# Patient Record
Sex: Male | Born: 1945 | Race: White | Hispanic: No | Marital: Married | Smoking: Former smoker
Health system: Southern US, Community
[De-identification: ages and names within clinical notes are randomized; demographics above are authoritative.]

## PROBLEM LIST (undated history)

## (undated) ENCOUNTER — Emergency Department (HOSPITAL_COMMUNITY): Payer: Self-pay | Source: Home / Self Care

## (undated) DIAGNOSIS — I10 Essential (primary) hypertension: Secondary | ICD-10-CM

## (undated) HISTORY — PX: BACK SURGERY: SHX140

---

## 1986-10-08 DIAGNOSIS — M549 Dorsalgia, unspecified: Secondary | ICD-10-CM | POA: Insufficient documentation

## 2004-07-19 ENCOUNTER — Ambulatory Visit: Payer: Self-pay | Admitting: Family Medicine

## 2004-07-19 ENCOUNTER — Inpatient Hospital Stay (HOSPITAL_COMMUNITY): Admission: EM | Admit: 2004-07-19 | Discharge: 2004-07-20 | Payer: Self-pay | Admitting: Emergency Medicine

## 2005-07-23 ENCOUNTER — Emergency Department (HOSPITAL_COMMUNITY): Admission: EM | Admit: 2005-07-23 | Discharge: 2005-07-23 | Payer: Self-pay | Admitting: Family Medicine

## 2007-05-06 ENCOUNTER — Inpatient Hospital Stay (HOSPITAL_COMMUNITY): Admission: EM | Admit: 2007-05-06 | Discharge: 2007-05-09 | Payer: Self-pay | Admitting: Emergency Medicine

## 2009-10-27 ENCOUNTER — Observation Stay (HOSPITAL_COMMUNITY): Admission: EM | Admit: 2009-10-27 | Discharge: 2009-10-27 | Payer: Self-pay | Admitting: Emergency Medicine

## 2010-06-16 ENCOUNTER — Emergency Department (HOSPITAL_COMMUNITY)
Admission: EM | Admit: 2010-06-16 | Discharge: 2010-06-16 | Payer: Self-pay | Source: Home / Self Care | Admitting: Emergency Medicine

## 2010-12-21 LAB — CBC
HCT: 40.9 % (ref 39.0–52.0)
Hemoglobin: 13.6 g/dL (ref 13.0–17.0)
MCH: 30.6 pg (ref 26.0–34.0)
MCHC: 33.3 g/dL (ref 30.0–36.0)
MCV: 92.1 fL (ref 78.0–100.0)
Platelets: 238 10*3/uL (ref 150–400)
RBC: 4.44 MIL/uL (ref 4.22–5.81)
RDW: 12.2 % (ref 11.5–15.5)
WBC: 7.4 10*3/uL (ref 4.0–10.5)

## 2010-12-21 LAB — URINALYSIS, ROUTINE W REFLEX MICROSCOPIC
Bilirubin Urine: NEGATIVE
Glucose, UA: NEGATIVE mg/dL
Hgb urine dipstick: NEGATIVE
Ketones, ur: NEGATIVE mg/dL
Nitrite: NEGATIVE
Protein, ur: NEGATIVE mg/dL
Specific Gravity, Urine: 1.011 (ref 1.005–1.030)
Urobilinogen, UA: 0.2 mg/dL (ref 0.0–1.0)
pH: 5.5 (ref 5.0–8.0)

## 2010-12-21 LAB — DIFFERENTIAL
Basophils Absolute: 0 10*3/uL (ref 0.0–0.1)
Basophils Relative: 1 % (ref 0–1)
Eosinophils Absolute: 0.1 10*3/uL (ref 0.0–0.7)
Eosinophils Relative: 1 % (ref 0–5)
Lymphocytes Relative: 27 % (ref 12–46)
Lymphs Abs: 2 10*3/uL (ref 0.7–4.0)
Monocytes Absolute: 0.4 10*3/uL (ref 0.1–1.0)
Monocytes Relative: 5 % (ref 3–12)
Neutro Abs: 5 10*3/uL (ref 1.7–7.7)
Neutrophils Relative %: 67 % (ref 43–77)

## 2010-12-21 LAB — BASIC METABOLIC PANEL
BUN: 13 mg/dL (ref 6–23)
CO2: 28 mEq/L (ref 19–32)
Calcium: 9.7 mg/dL (ref 8.4–10.5)
Chloride: 107 mEq/L (ref 96–112)
Creatinine, Ser: 0.86 mg/dL (ref 0.4–1.5)
GFR calc Af Amer: >60 mL/min (ref >60)
GFR calc non Af Amer: >60 mL/min (ref >60)
Glucose, Bld: 187 mg/dL — ABNORMAL HIGH (ref 70–99)
Potassium: 4.5 mEq/L (ref 3.5–5.1)
Sodium: 140 mEq/L (ref 135–145)

## 2010-12-21 LAB — POCT CARDIAC MARKERS
CKMB, poc: <1 ng/mL — ABNORMAL LOW (ref 1.0–8.0)
Myoglobin, poc: 37.9 ng/mL (ref 12–200)
Troponin i, poc: <0.05 ng/mL (ref 0.00–0.09)

## 2010-12-24 LAB — COMPREHENSIVE METABOLIC PANEL
ALT: 13 U/L (ref 0–53)
AST: 15 U/L (ref 0–37)
Albumin: 3.2 g/dL — ABNORMAL LOW (ref 3.5–5.2)
Alkaline Phosphatase: 39 U/L (ref 39–117)
BUN: 8 mg/dL (ref 6–23)
CO2: 25 mEq/L (ref 19–32)
Calcium: 7.9 mg/dL — ABNORMAL LOW (ref 8.4–10.5)
Chloride: 108 mEq/L (ref 96–112)
Creatinine, Ser: 0.58 mg/dL (ref 0.4–1.5)
GFR calc Af Amer: >60 mL/min (ref >60)
GFR calc non Af Amer: >60 mL/min (ref >60)
Glucose, Bld: 93 mg/dL (ref 70–99)
Potassium: 3.5 mEq/L (ref 3.5–5.1)
Sodium: 141 mEq/L (ref 135–145)
Total Bilirubin: 0.8 mg/dL (ref 0.3–1.2)
Total Protein: 5.3 g/dL — ABNORMAL LOW (ref 6.0–8.3)

## 2010-12-24 LAB — POCT I-STAT, CHEM 8
BUN: 18 mg/dL (ref 6–23)
Calcium, Ion: 1.1 mmol/L — ABNORMAL LOW (ref 1.12–1.32)
Chloride: 107 mEq/L (ref 96–112)
Creatinine, Ser: 0.9 mg/dL (ref 0.4–1.5)
Glucose, Bld: 118 mg/dL — ABNORMAL HIGH (ref 70–99)
HCT: 36 % — ABNORMAL LOW (ref 39.0–52.0)
Hemoglobin: 12.2 g/dL — ABNORMAL LOW (ref 13.0–17.0)
Potassium: 3.9 mEq/L (ref 3.5–5.1)
Sodium: 140 mEq/L (ref 135–145)
TCO2: 26 mmol/L (ref 0–100)

## 2010-12-24 LAB — GLUCOSE, CAPILLARY: Glucose-Capillary: 93 mg/dL (ref 70–99)

## 2010-12-24 LAB — CK TOTAL AND CKMB (NOT AT ARMC)
CK, MB: 2 ng/mL (ref 0.3–4.0)
Relative Index: INVALID (ref 0.0–2.5)
Total CK: 90 U/L (ref 7–232)

## 2010-12-24 LAB — CBC
HCT: 31.7 % — ABNORMAL LOW (ref 39.0–52.0)
HCT: 34.3 % — ABNORMAL LOW (ref 39.0–52.0)
Hemoglobin: 10.8 g/dL — ABNORMAL LOW (ref 13.0–17.0)
Hemoglobin: 12.1 g/dL — ABNORMAL LOW (ref 13.0–17.0)
MCHC: 34 g/dL (ref 30.0–36.0)
MCHC: 35.3 g/dL (ref 30.0–36.0)
MCV: 92.3 fL (ref 78.0–100.0)
MCV: 94 fL (ref 78.0–100.0)
Platelets: 205 10*3/uL (ref 150–400)
Platelets: 244 10*3/uL (ref 150–400)
RBC: 3.38 MIL/uL — ABNORMAL LOW (ref 4.22–5.81)
RBC: 3.72 MIL/uL — ABNORMAL LOW (ref 4.22–5.81)
RDW: 13.2 % (ref 11.5–15.5)
RDW: 13.6 % (ref 11.5–15.5)
WBC: 12.2 10*3/uL — ABNORMAL HIGH (ref 4.0–10.5)
WBC: 8.2 10*3/uL (ref 4.0–10.5)

## 2010-12-24 LAB — URINALYSIS, ROUTINE W REFLEX MICROSCOPIC
Bilirubin Urine: NEGATIVE
Glucose, UA: NEGATIVE mg/dL
Hgb urine dipstick: NEGATIVE
Ketones, ur: NEGATIVE mg/dL
Nitrite: NEGATIVE
Protein, ur: NEGATIVE mg/dL
Specific Gravity, Urine: 1.011 (ref 1.005–1.030)
Urobilinogen, UA: 0.2 mg/dL (ref 0.0–1.0)
pH: 5.5 (ref 5.0–8.0)

## 2010-12-24 LAB — CARDIAC PANEL(CRET KIN+CKTOT+MB+TROPI)
CK, MB: 1.4 ng/mL (ref 0.3–4.0)
CK, MB: 1.6 ng/mL (ref 0.3–4.0)
Relative Index: INVALID (ref 0.0–2.5)
Relative Index: INVALID (ref 0.0–2.5)
Total CK: 64 U/L (ref 7–232)
Total CK: 73 U/L (ref 7–232)
Troponin I: 0.02 ng/mL (ref 0.00–0.06)
Troponin I: 0.03 ng/mL (ref 0.00–0.06)

## 2010-12-24 LAB — DIFFERENTIAL
Basophils Absolute: 0.1 10*3/uL (ref 0.0–0.1)
Basophils Absolute: 0.1 10*3/uL (ref 0.0–0.1)
Basophils Relative: 0 % (ref 0–1)
Basophils Relative: 1 % (ref 0–1)
Eosinophils Absolute: 0 10*3/uL (ref 0.0–0.7)
Eosinophils Absolute: 0 10*3/uL (ref 0.0–0.7)
Eosinophils Relative: 0 % (ref 0–5)
Eosinophils Relative: 0 % (ref 0–5)
Lymphocytes Relative: 17 % (ref 12–46)
Lymphocytes Relative: 9 % — ABNORMAL LOW (ref 12–46)
Lymphs Abs: 1 10*3/uL (ref 0.7–4.0)
Lymphs Abs: 1.4 10*3/uL (ref 0.7–4.0)
Monocytes Absolute: 0.5 10*3/uL (ref 0.1–1.0)
Monocytes Absolute: 0.5 10*3/uL (ref 0.1–1.0)
Monocytes Relative: 4 % (ref 3–12)
Monocytes Relative: 7 % (ref 3–12)
Neutro Abs: 10.5 10*3/uL — ABNORMAL HIGH (ref 1.7–7.7)
Neutro Abs: 6.2 10*3/uL (ref 1.7–7.7)
Neutrophils Relative %: 76 % (ref 43–77)
Neutrophils Relative %: 87 % — ABNORMAL HIGH (ref 43–77)

## 2010-12-24 LAB — POCT CARDIAC MARKERS
CKMB, poc: 1.4 ng/mL (ref 1.0–8.0)
Myoglobin, poc: 62.2 ng/mL (ref 12–200)
Troponin i, poc: <0.05 ng/mL (ref 0.00–0.09)

## 2010-12-24 LAB — MYOGLOBIN, URINE: Myoglobin, Ur: <27 mcg/L (ref <28)

## 2011-02-20 NOTE — Consult Note (Signed)
[name], [name] NO.:  [id number]   MEDICAL RECORD NO.:  [id number]          PATIENT TYPE:  INP   LOCATION:  6739                         FACILITY:  [facility]   PHYSICIAN:  Kathaleen Maser. Pool, M.D.    DATE OF BIRTH:  01/25/1946   DATE OF CONSULTATION:  05/07/2007  DATE OF DISCHARGE:                                 CONSULTATION   SERVICE:  Neurosurgery.   HISTORY OF PRESENT ILLNESS:  Mr. Louis [name] is a 65 year old male who has a  remote history of an L5-S1 laminotomy and diskectomy done in [place]  approximately 4 years ago.  The patient has had some chronic lumbosacral  pain without significant radiculopathy since that time.  The patient now  has an approximately 10-day history of severe lumbar pain with radiation  to both lower extremities.  This has progressively worsened with  increasing numbness, paresthesias and weakness involving both lower  extremities.  The patient suffered a fall late Saturday night with  increasing back pain and bilateral lower extremity symptoms.  This has  also been associated with increasing numbness involving his perineal and  perianal region.  He remains continent of urine and continent of stool;  however, he does admit that his voiding sensation and urgency do not  feel normal to him.  The patient has been essentially nonambulatory for  a least 1 week.  He reports that he has been unable to move his feet for  at least 72 hours, if not longer.   PAST MEDICAL HISTORY:  Is notable for the aforementioned lumbar surgery.  The patient has a history of esophageal spasms, otherwise is in  reasonably good health.   CURRENT MEDICATIONS:  Percocet, Relafen, he has been on a steroid  Dosepak, trazodone and clonazepam.   SOCIAL HISTORY:  The patient is a smoker.  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  He is married.   REVIEW OF SYSTEMS:  Noncontributory.   EXAMINATION:  He is awake and alert, oriented and appropriate.  Cranial  nerve function is intact.  Examination of the upper extremities reveal  intact motor strength and sensory function bilaterally.  Examination of  his lower extremities reveal weakness involving his anterior tibialis  and extensor hallicis longus, grading out at 1/5 to 2/5 bilaterally.  He  has 2/5 strength in both gastrocnemius muscles.  He has diminished  sensation from L5 distally bilaterally.  He does have some saddle  anesthesia in his perineal region.   I reviewed the patient's MRI scan of his lumbar spine.  This  demonstrates evidence of critical stenosis at the L4-5 level secondary  to facet arthropathy and a superimposed large central disk herniation by  somewhat towards the right.   I believe this patient is suffering symptoms of severe spinal stenosis  secondary to the disk herniation at L4-5 and his coexistent spinal  stenosis from facet arthropathy.  Given the degree of his stenosis  coupled with his marked symptomatology, I think that surgical  decompression is his only option for improvement.  I would restart him  on IV steroids,  and our plan is to proceed urgently with an L4-5  decompressive laminectomy and probable bilateral microdiskectomy.  I  discussed the risks and benefits involved with surgery including but not  limited to risk of  anesthesia, bleeding, infection, CSF leak, nerve root injury, disk  herniation, __________ , pain not better.  The patient has been given  the opportunity to ask questions and appears to understand.  He wishes  to proceed with surgery.  We will move forward with this tomorrow around  3 p.m.           ______________________________  Kathaleen Maser. Pool, M.D.     HAP/MEDQ  D:  05/07/2007  T:  05/07/2007  Job:  [id number]

## 2011-02-20 NOTE — Op Note (Signed)
[name], ROSSETTI NO.:  [id number]   MEDICAL RECORD NO.:  [id number]          PATIENT TYPE:  INP   LOCATION:  6739                         FACILITY:  [facility]   PHYSICIAN:  Kathaleen Maser. Pool, M.D.    DATE OF BIRTH:  03/07/1946   DATE OF PROCEDURE:  05/07/2007  DATE OF DISCHARGE:                               OPERATIVE REPORT   ATTENDING PHYSICIAN:  Sherilyn Cooter A. Pool, M.D.   SERVICE:  Neurosurgery.   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   PROCEDURE:  Re-exploration of L4-5 laminectomy with redo bilateral  decompressive laminectomy and L4 and L5 nerve root foraminotomies.  Right L4-5 microdiskectomy.   SURGEON:  Kathaleen Maser. Pool, M.D.   ASSISTANT:  Donalee Citrin, M.D.   ANESTHESIA:  General endotracheal.   INDICATIONS:  Mr. Louis [name] is a 65 year old male who is status post  previous lumbar surgery at L4-5 and L5-S1.  The patient presents now  with severe back and bilateral lower extremity pain, paresthesias and  weakness with evidence of diminished bowel and bladder function and  saddle anesthesia around his perineum consistent with a cauda equina  syndrome which is fortunately not yet complete.  The patient has  undergone an MRI scan which demonstrates critical stenosis at L4-5  secondary to facet arthropathy, ligamentous buckling and an acute right-  sided paracentral disk herniation.  We discussed options of management  including possibility of moving forward with a relatively emergent L4-5  decompression with microdiskectomy.  Patient is aware of the risks and  benefits including but not limited to risks of anesthesia, risks of  bleeding, infection, CSF leak, nerve root injury, disk herniation,  worsening or continued pain and nonbenefit.  The patient was given the  opportunity to answer questions and wished to proceed.   OPERATIVE NOTE:  The patient  was brought to the operating room and  placed on the table in the supine position.  After an adequate level of  anesthesia was achieved, the patient was positioned prone onto the  Wilson frame, appropriately padded and positioned.  The lumbar region  was prepped and draped sterilely.  A 10 blade was used to make a skin  incision overlying the L4-5 area.  This was carried down carried sharply  in the midline.  Subperiosteal dissection was then performed exposing  the lamina and facet joints at L4 and L5 bilaterally.  The patient has  had a previous complete laminectomy at L5 which tracks up into the L4-5  interspace.  Epidural scar was dissected free and removed using Kerrison  rongeurs.  Laminectomy of L4 was then performed using high-speed drill  and Kerrison rongeurs to remove the inferior two-thirds of the lamina of  L4, medial aspect of the L4-5 facet joint bilaterally and residual  aspects of the L5 lamina bilaterally.  Ligament flavum and epidural scar  were then elevated and resected in piecemeal fashion using Kerrison  rongeurs.  Underlying thecal sac and exiting L4 and L5 nerve roots were  identified.  Wide decompressive foraminotomy was then performed along  the course of the exiting nerve roots bilaterally.  On the right side,  the thecal sac and L5 nerve was gently mobilized and tracked towards the  midline.  The disk herniation was then identified and dissected free  using blunt nerve hooks.  Large fragments of disk herniation were  encountered and completely resected.  Disk space was then incised with a  15 blade in a rectangular fashion.  A wide disk space clean-out was  achieved using pituitary rongeurs, up and downbiting pituitary rongeurs  and Epstein curettes.  All elements of the disk herniation were  completely resected.  All loose or obviously degenerative disk material  was removed.  A small laceration of the ventral surface of the L5 nerve  root was made on the  right side during the dissection of the nerve root  from the surrounding scar.  This transiently leaked a small amount of  CSF, but this spontaneously stopped.  There was nothing visible that I  thought would benefit from a suture.  The spinal canal was inspected  also from the left side.  There was no evidence of any residual  compression or disk herniation.  The wound was then irrigated with  antibiotics solution.  Tisseel fibrin glue sealant was then placed in  the laminectomy defect as was Gelfoam.  The wound was then closed in  layers with Vicryl sutures, and the skin was reapproximated with a  running interlocking 3-0 nylon and in a vertical mattress fashion.  The  patient tolerated the procedure well and he returned to the recovery  room postoperatively.           ______________________________  Kathaleen Maser Pool, M.D.     HAP/MEDQ  D:  05/07/2007  T:  05/08/2007  Job:  [id number]

## 2011-02-20 NOTE — H&P (Signed)
NAMEANDREJ, SPAGNOLI NO.:  [id number]   MEDICAL RECORD NO.:  [id number]          PATIENT TYPE:  EMS   LOCATION:  MINO                         FACILITY:  [facility]   PHYSICIAN:  Michaelyn Barter, M.D. DATE OF BIRTH:  1946/08/31   DATE OF ADMISSION:  05/06/2007  DATE OF DISCHARGE:                              HISTORY & PHYSICAL   PRIMARY CARE DOCTOR:  Unassigned.   CHIEF COMPLAINT:  Back pain.   HISTORY OF PRESENT ILLNESS:  Mr. Ardis is a 65 year old gentleman who  states that he had some ongoing chronic lower back pain; however,  approximately one week ago, the patient was camping over the course of  the weekend.  He states that he did not increase his activity level, he  was simply completing his regular ADLs when he began to develop lower  back pain.  There was no trauma to his lower back.  According to his  wife, at one particular instance, he had attempted to walk up the stairs  into their RV, and his legs buckled secondary to the pain.  They left  the campsite, and the following Monday, he called his primary care  physician, who started him on a tapering dose of prednisone.  He  indicated that the prednisone did help to alleviate his pain; however,  the patient's wife indicated that the patient's ability to walk had  become significantly compromised.  For approximately a week, the  patient, who typically walks without assistance, has been reduced to  crawling on both his hands and knees.  The patient indicates that his  pain level had decreased, until yesterday when he attempted to use a  bedside toilet.  As he attempted to get up off of the toilet, his lower  buttock area became entangled between the toilet and a chair that was  nearby.  Shortly afterwards his tailbone began to hurt.  Since that  time, it has been constant.  He indicates that he chronically has some  numbness within his lower extremities; however, yesterday the numbness  in his left foot in  particular was a bit more pronounced.  He denies any  incontinence for urine or his bowels.  There have been no fevers or  chills.   PAST MEDICAL HISTORY:  1. Lower back pain.  2. Fibromyalgia.  3. GERD.  4. Post-traumatic stress disorder.  5. Diabetes mellitus.  The patient states that he was diagnosed with      diabetes mellitus some years ago; however, he has not had to take      any medications, it has simply been diet-controlled.  6. Post-traumatic stress disorder.  7. Panic attacks.  8. Hypertension.  9. Fibromyalgia.  10.Hypoglycemia.   PAST SURGICAL HISTORY:  The patient had lower back surgery on the lumbar  region of his back approximately five years ago in [place].   ALLERGIES:  PENICILLIN produces hives.   CURRENT MEDICATIONS:  1. Oxycodone 5 mg 3 tablets p.o. q.i.d.  2. Prednisone tapering dose pack.  3. Nabumetone 500 mg 2 tablets p.o. daily.  4. Trazodone 50 mg 1-2 tablets  p.o. nightly.  5. Clonazepam 1 mg tablet p.o. in the morning and a half tablet p.o.      nightly.   SOCIAL HISTORY:  Cigarettes:  The patient stopped smoking 15 years ago.  He smoked 1-1/2 packs per day prior to that.  Alcohol:  The patient  stopped drinking alcohol 20 years ago.  He drank heavily prior to that.   FAMILY HISTORY:  Mother had heart disease and rheumatoid arthritis.  Father had Alzheimer's dementia and prostate cancer.   REVIEW OF SYSTEMS:  As per HPI.   PHYSICAL EXAMINATION:  VITALS:  Temperature is 98.1.  Blood pressure  165/74.  Heart rate is 94.  Respirations 20.  O2 sat 97%.  HEENT:  Normocephalic and atraumatic.  Anicteric.  Extraocular movements  are intact.  Oral mucosa is pink.  No thrush.  No exudate.  NECK:  No JVD.  Supple.  No lymphadenopathy.  No thyromegaly.  CARDIAC:  S1 and S2 present.  Regular rate and rhythm.  No murmurs, no  gallops, no rubs.  RESPIRATORY:  No crackles or wheezes.  ABDOMEN:  Flat, soft, nontender, nondistended.  Positive bowel sounds.   No masses palpated.  EXTREMITIES:  No leg edema.  BACK:  There are obvious signs of trauma to the patient's back.  There  is no ecchymotic areas on the patient's back.  With regards to an area  that is most pronounced in pain location, this would be the area  corresponding to the lowest sacral region involving the coccyx area.  The patient had no significant tenderness to palpation of his lumbar  area, but he did complain of some tenderness over the lower  sacral/coccyx area to palpation.  MUSCULOSKELETAL:  Arm strength 5/5.  Bilateral leg strength 5/5.  Sensation appeared to be intact over both lower extremities; however,  the patient did complain of decreased sensation on the dorsal surface of  his both of his feet bilaterally.  NEUROLOGIC:  Patient is alert and oriented x3.  Cranial nerves II-XII  are intact.   An MRI was completed, which revealed the most significant finding at the  L4-5 region where there is right disk protrusion with marked spinal  stenosis.   LABS:  The pH is 7.458, pCO2 38.4, bicarb 27.1.  Hemoglobin 16.7,  hematocrit 49.  Sodium 139, potassium 4.4, chloride 104, glucose 120,  BUN 23, creatinine 0.8.  Urinalysis is negative.   ASSESSMENT/PLAN:  1. Acute on chronic back pain.  The etiology of this may be related to      the L4-5 disk protrusion with spinal stenosis seen on MRI, which      may have been exacerbated by the trauma that occurred as the      patient attempted to rise from the toilet yesterday.  We will      provide the patient with p.r.n. pain medication for now.  We will      consider resuming steroids on the patient.  We consider an      orthopedic versus neurosurgery consultation.  2. Hypertension:  This is slightly elevated.  We will consider      starting an antihypertensive medication on the patient.  3. History of post-traumatic stress disorder.  We will resume the      patient's previously prescribed home      medications.  4.  Gastrointestinal prophylaxis:  We will provide Protonix.  5. Deep venous thrombosis prophylaxis:  We will provide Lovenox.      [employer]  Roxan Hockey, M.D.  Electronically Signed     OR/MEDQ  D:  05/06/2007  T:  05/06/2007  Job:  [id number]

## 2011-02-23 NOTE — Discharge Summary (Signed)
NAMEVIHAAN, GLOSS NO.:  [id number]   MEDICAL RECORD NO.:  [id number]          PATIENT TYPE:  INP   LOCATION:  2041                         FACILITY:  [facility]   PHYSICIAN:  Pearlean Brownie, M.D.DATE OF BIRTH:  03/31/1946   DATE OF ADMISSION:  07/18/2004  DATE OF DISCHARGE:  07/19/2004                                 DISCHARGE SUMMARY   DIAGNOSES:  1.  Hypoglycemia.  2.  Diabetes mellitus type 1.  3.  Post-traumatic stress disorder.  4.  Hypertension.   CONSULTATIONS:  No consultations were done this admission.   PROCEDURE:  No procedures were done this admission.   MEDICATIONS:  1.  Insulin Regular 40 units at night.  2.  Sliding scale insulin Novolog.  3.  Norvasc 10 mg q.d.  4.  Toprol XL 150 mg q.d.  5.  Vicodin 5/500 mg p.r.n. pain.   LABORATORY DATA:  On admission, sodium 140, potassium 3.5, chloride 103,  bicarbonate 20, BUN 17, creatinine 1.5, glucose 77.  CBC showed white blood  cell 6.4, hemoglobin 12.9, hematocrit 38, platelets 285,000.  AST 21, ALT  23, alkaline phosphate 110, total bilirubin 0.3.  Urinalysis negative.  Blood culture pending.  Hemoglobin glycosylate pending.  TSH pending.  Cardiac markers show a troponin less than 0.01.  CK-MB 2.3, CK 209.   Chest x-ray within normal limits.  ECG normal sinus rhythm, PVCs and PACs,  infarct age undetermined.  Telemetry at discharge shows normal sinus rhythm.   DISPOSITION:  The patient was admitted with hypoglycemia and with a history  of recent upper respiratory tract infection.   HOSPITAL COURSE:  Problem 1:  HYPOGLYCEMIA:  The patient was ruled out for  infection.  CBC within normal limits.  Possible misunderstanding of  management of diabetes.   Problem 2:  HYPERTENSION:  It stayed stable during admission.  We readjusted  medication and add hydrochlorothiazide, ACE inhibitor to the treatment.   Problem 3:  ECTOPIC HEART RATE:  Normal sinus rhythm during hospital stay.  The vital  signs were within normal limits and normalized to sinus regular  rhythm of 69.   DISCHARGE MEDICATIONS:  1.  Hydrochlorothiazide/Lisinopril 12.5/20 mg 1 tablet p.o. q.d.  2.  Norvasc 10 mg p.o. q.d.  3.  Toprol XL 50 mg p.o. q.d.  4.  Lantus insulin 30 units at night.  5.  Sliding scale insulin Novolog as needed.   DIET:  Fixed diet, carbohydrate-modified diet.   FOLLOW UP:  Follow-up appointment with Dr. Jimmey Ralph July 25, 2004 at 4:45  p.m.       IM/MEDQ  D:  07/19/2004  T:  07/19/2004  Job:  [id number]   cc:   H. Tilda Franco, M.D.  [address]  [place]  [place] [address]  Fax: [phone]

## 2011-02-23 NOTE — Discharge Summary (Signed)
NAMEBINNIE, Louis [name] NO.:  [id number]   MEDICAL RECORD NO.:  [id number]          PATIENT TYPE:  INP   LOCATION:  6739                         FACILITY:  [facility]   PHYSICIAN:  Kathaleen Maser. Pool, M.D.    DATE OF BIRTH:  1946-03-31   DATE OF ADMISSION:  05/06/2007  DATE OF DISCHARGE:  05/09/2007                               DISCHARGE SUMMARY   FINAL DIAGNOSIS:  L4-5 stenosis/herniated nucleus pulposus with cauda  equina syndrome.   HISTORY OF PRESENT ILLNESS:  Mr. Louis [name] is a 65 year old male with a  history of chronic back pain status post previous L5-S1 laminectomy  remotely in the past, who presents with severe worsening of pain and  bilateral lower extremity weakness consistent with a severe cauda equina  syndrome.  Workup demonstrates evidence of critical stenosis at L4-5  secondary to facet arthropathy and a broad-based disk herniation.  The  patient presents now for surgical decompression after a consultation was  made of me by the medicine service.   HOSPITAL COURSE:  The patient was admitted by medicine and workup  demonstrated the stenosis.  A referral was made to me.  I assumed his  care and took him to the operating room, where uncomplicated L4-5  decompressive laminectomy and right-sided L4-5 microdiskectomy was  performed.  Postoperatively the patient was much improved.  He had  improved motor and sensory function.  Bowel and bladder function  returned.  The patient was gradually able to be mobilized with the aid  of physical therapy.  He was able to be discharged home.  At the time of  discharge the patient is ambulatory without assistance.  His wound is  healing well.  His strength is much improved.  He now has of 3/5  dorsiflexion strength in both feet and 4/5 plantar flexion strength in  both feet.   CONDITION ON DISCHARGE:  Improved.           ______________________________  Kathaleen Maser Pool, M.D.     HAP/MEDQ  D:  07/01/2007  T:  07/02/2007   Job:  [id number]

## 2011-02-23 NOTE — Discharge Summary (Signed)
NAMEBEUFORD, GARCILAZO NO.:  [id number]   MEDICAL RECORD NO.:  [id number]          PATIENT TYPE:  INP   LOCATION:  2041                         FACILITY:  [facility]   PHYSICIAN:  Adrian Blackwater, MDDATE OF BIRTH:  06-Jul-1946   DATE OF ADMISSION:  07/19/2004  DATE OF DISCHARGE:  07/20/2004                                 DISCHARGE SUMMARY   ADMISSION DIAGNOSIS:  Chest tightness.   DISCHARGE DIAGNOSIS:  Atypical chest discomfort with vagal syncope.   OTHER DIAGNOSIS:  1.  Fibromyalgia.  2.  Gastroesophageal reflux disease, esophageal spasms.  3.  PTSD.   CONSULTATIONS:  None.   PROCEDURES:  Chest x-ray October 12 was within normal limits.   MEDICATIONS AT DISCHARGE:  1.  Aspirin 325 mg one tablet daily.  2.  Celexa 20 mg daily.  3.  Omeprazole 20 mg daily with meals.  4.  Relafen 500 mg two tablets daily as needed for pain.  5.  Continue home medication regimen for IBS.   HISTORY OF PRESENT ILLNESS:  This is a 65 year old white male with history  of fibromyalgia who was sitting at his desk as usual and felt something on  his leg.  He stood up, went to the bathroom and became sweaty with shortness  of breath and almost fell down.  He experienced some kind of chest tightness  and hard palpitations.  Denies any history of coronary artery disease,  angina or arrhythmia.  With the presyncopal episode, he became shaky and  confused.  Denies any loss of consciousness or incontinence.  He came to the  ER by car.  At admission, he had low blood pressure resolving within  minutes.  He denies any clumsiness, falls, or slurred speech.  The patient  was put on aspirin 325 mg daily as DVT and cardiovascular prophylaxis.  Continue Celexa treatment for his anxiety and Relafen for chronic pain.  Relafen was discontinued.  Switched to Darvocet, trying to relieve his pain  and better control of possible GERD.  He also received Protonix 40 mg p.o.  b.i.d. as prophylaxis.   Also, received nitroglycerin 0.4 mg sublingual for  this pain, possible cardiac origin.  He was ordered p.r.n.   During hospital course, the pain improved, being 3 over 10 at discharge.  The patient started having some diarrhea due to irritable bowel syndrome  that he controlled with TUMS.   LABORATORY DATA:  During hospital course, Hemoglobin 14.1, hematocrit 40.2.  Sodium 138, potassium 4.2, chloride 107, bicarbonate 24, BUN 10, creatinine  1, glucose 116, lipase 26, albumin 3.5, total protein 5.6, AST 24, ALT 27,  alkaline phosphatase 55, total bilirubin 0.4.  Cardiac enzyme panel was done  at the emergency department x3 within normal limits and was repeated every 8  hours x3 within normal limits.  Fasting lipid profile was done.  Cholesterol  193, HDL 43, VDL 26, triglyceride 132, LDL 124.  Parameters are within  normal limits.  No increased risk of CAD at this moment.  Hemoglobin  glucosamine 5.3 which is normal.  Urinalysis was done and showed few  bacteria born nitrates negative, leukocytes were negative.  Not compatible  with urinary infection at this moment, but may need further workup due to  current history of UTI.  EKG done within normal limits.  If the patient  continues with episodes of brief syncope, he may need further workup with  echocardiogram or stress test as an outpatient.   DISCHARGE SUMMARY:  Attending physician Pearlean Brownie, M.D.       IM/MEDQ  D:  07/20/2004  T:  07/20/2004  Job:  [id number]   cc:   Corrie Mckusick, M.D.  Fax: [phone]

## 2011-07-23 LAB — BASIC METABOLIC PANEL
CO2: 26
Chloride: 104
GFR calc Af Amer: >60
Sodium: 138

## 2011-07-23 LAB — I-STAT 8, (EC8 V) (CONVERTED LAB)
BUN: 23
Chloride: 104
pCO2, Ven: 38.2 — ABNORMAL LOW
pH, Ven: 7.458 — ABNORMAL HIGH

## 2011-07-23 LAB — CBC
HCT: 42.3
Hemoglobin: 14.5
MCHC: 34.3
MCV: 91.7
RBC: 4.61

## 2011-07-23 LAB — LIPID PANEL
HDL: 48
Triglycerides: 108
VLDL: 22

## 2011-07-23 LAB — URINALYSIS, ROUTINE W REFLEX MICROSCOPIC
Bilirubin Urine: NEGATIVE
Ketones, ur: NEGATIVE
Nitrite: NEGATIVE
Specific Gravity, Urine: 1.022
Urobilinogen, UA: 0.2

## 2011-07-23 LAB — POCT I-STAT CREATININE: Creatinine, Ser: 0.8

## 2016-06-08 DEATH — deceased

## 2020-02-16 ENCOUNTER — Emergency Department (HOSPITAL_COMMUNITY)
Admission: EM | Admit: 2020-02-16 | Discharge: 2020-02-16 | Disposition: A | Discharge status: Home / Self Care | Payer: No Typology Code available for payment source | Attending: Emergency Medicine | Admitting: Emergency Medicine

## 2020-02-16 ENCOUNTER — Other Ambulatory Visit: Payer: Self-pay

## 2020-02-16 ENCOUNTER — Emergency Department (HOSPITAL_COMMUNITY): Payer: No Typology Code available for payment source

## 2020-02-16 ENCOUNTER — Encounter (HOSPITAL_COMMUNITY): Payer: Self-pay | Admitting: Emergency Medicine

## 2020-02-16 DIAGNOSIS — M545 Low back pain, unspecified: Secondary | ICD-10-CM

## 2020-02-16 DIAGNOSIS — M25552 Pain in left hip: Secondary | ICD-10-CM | POA: Insufficient documentation

## 2020-02-16 DIAGNOSIS — Z79899 Other long term (current) drug therapy: Secondary | ICD-10-CM | POA: Diagnosis not present

## 2020-02-16 DIAGNOSIS — Z7901 Long term (current) use of anticoagulants: Secondary | ICD-10-CM | POA: Diagnosis not present

## 2020-02-16 DIAGNOSIS — I10 Essential (primary) hypertension: Secondary | ICD-10-CM | POA: Insufficient documentation

## 2020-02-16 HISTORY — DX: Essential (primary) hypertension: I10

## 2020-02-16 MED ORDER — LIDOCAINE 5 % EX PTCH
1.0000 | MEDICATED_PATCH | Freq: Once | CUTANEOUS | Status: DC
  Administered 2020-02-16: 1 via TRANSDERMAL
  Filled 2020-02-16: qty 1

## 2020-02-16 MED ORDER — METHOCARBAMOL 500 MG PO TABS: 500.0000 mg | ORAL_TABLET | Freq: Every day | ORAL | 0 refills | Status: AC

## 2020-02-16 MED ORDER — DICLOFENAC SODIUM 1 % EX GEL: 2.0000 g | Freq: Four times a day (QID) | CUTANEOUS | 0 refills | Status: AC

## 2020-02-16 MED ORDER — PREDNISONE 10 MG (21) PO TBPK: ORAL_TABLET | Freq: Every day | ORAL | 0 refills | Status: AC

## 2020-02-16 NOTE — ED Triage Notes (Signed)
Pt reports chronic back pain - pt states hurts all the time pt 2 weeks ago after yard work he has been having worse lower back pain into left hip.

## 2020-02-16 NOTE — Discharge Instructions (Signed)
You have been seen today for back. Please read and follow all provided instructions. Return to the emergency room for worsening condition or new concerning symptoms.    Your xrays do not show any broken bones.  1. Medications:  -Prescription sent to your pharmacy for Robaxin.  This is a muscle relaxer.  Please take this as prescribed.  This medicine can make you drowsy so do not drive or work when taking.  Most people take it before bed at night.  -Prescription also sent for steroid Dosepak.  Please take these as prescribed.  This helps with inflammation.  -Prescription sent to the pharmacy for Voltaren gel.  You can apply this to your back as needed to help with pain and inflammation.  -You can also take Tylenol as needed for pain.  Continue usual home medications Take medications as prescribed. Please review all of the medicines and only take them if you do not have an allergy to them.   2. Treatment: rest, drink plenty of fluids  3. Follow Up:  Please follow up with your spine doctor for further evaluation of your pain.   It is also a possibility that you have an allergic reaction to any of the medicines that you have been prescribed - Everybody reacts differently to medications and while MOST people have no trouble with most medicines, you may have a reaction such as nausea, vomiting, rash, swelling, shortness of breath. If this is the case, please stop taking the medicine immediately and contact your physician.  ?

## 2020-02-16 NOTE — ED Provider Notes (Signed)
[facility] EMERGENCY DEPARTMENT Provider Note   CSN: [id number] Arrival date & time: 02/16/20  1057     History Chief Complaint  Patient presents with  . Back Pain    Louis [name] is a 74 y.o. male with past medical history significant for hypertension, chronic back pain presents to emergency department today with chief complaint of progressively worsening back pain x 3 weeks. Patient states the pain got worse after pulling weeds in the yard. He states he was kneeling while wearing knee pads for four hour per day x 3 days. He is describing his pain as an aching and throbbing sensation. The pain is located in his left lower back and radiates to his left thigh. His pain is worse when walking or standing up right. He wears a 75 mcg fentanyl patch for chronic pain and percocet as needed. He denies any improvement in pain despite taking his home medications.  He rates the pain 7/10 in severity.  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 He has had 2 back surgeries in 2003 and 2008. Patient had L4-L5 laminectomy with redo, bilateral decompressive laminectomy at L4-L5 nerve root foraminotomies and right L4-L5 microdiscectomy  Past Medical History:  Diagnosis Date  . Hypertension     There are no problems to display for this patient.   Past Surgical History:  Procedure Laterality Date  . BACK SURGERY         No family history on file.  Social History   Tobacco Use  . Smoking status: Not on file  Substance Use Topics  . Alcohol use: Not on file  . Drug use: Not on file    Home Medications Prior to Admission medications   Medication Sig Start Date End Date Taking? Authorizing Provider  Ascorbic Acid (VITAMIN C) 1000 MG tablet Take 1,000 mg by mouth daily.   Yes [provider]  cholecalciferol (VITAMIN D3) 25 MCG (1000 UNIT) tablet Take 1,000 Units by mouth daily.   Yes [provider]   clonazePAM (KLONOPIN) 1 MG tablet Take 2 mg by mouth daily as needed for anxiety.   Yes [provider]  clopidogrel (PLAVIX) 75 MG tablet Take 75 mg by mouth daily.   Yes [provider]  Cyanocobalamin (VITAMIN B 12 PO) Take 1 tablet by mouth daily.   Yes [provider]  cyclobenzaprine (FLEXERIL) 10 MG tablet Take 10 mg by mouth at bedtime.    Yes [provider]  donepezil (ARICEPT) 10 MG tablet Take 5 mg by mouth at bedtime.   Yes [provider]  fentaNYL (DURAGESIC) 75 MCG/HR Place 1 patch onto the skin every other day.   Yes [provider]  fluticasone (FLONASE) 50 MCG/ACT nasal spray Place 1 spray into both nostrils daily as needed for allergies or rhinitis.   Yes [provider]  Multiple Vitamin (MULTIVITAMIN WITH MINERALS) TABS tablet Take 1 tablet by mouth daily.   Yes [provider]  oxyCODONE (OXY IR/ROXICODONE) 5 MG immediate release tablet Take 10 mg by mouth daily as needed for severe pain.   Yes [provider]  tetrahydrozoline-zinc (VISINE-AC) 0.05-0.25 % ophthalmic solution Place 2 drops into both eyes daily as needed (for dry eyes).   Yes [provider]  diclofenac Sodium (VOLTAREN) 1 % GEL Apply 2 g topically 4 (four) times daily. 02/16/20   Clavin Ruhlman E, PA-C  methocarbamol (ROBAXIN) 500 MG tablet  Take 1 tablet (500 mg total) by mouth at bedtime for 7 days. 02/16/20 02/23/20  Allanah Mcfarland E, PA-C  predniSONE (STERAPRED UNI-PAK 21 TAB) 10 MG (21) TBPK tablet Take by mouth daily. Take 6 tabs by mouth daily  for 2 days, then 5 tabs for 2 days, then 4 tabs for 2 days, then 3 tabs for 2 days, 2 tabs for 2 days, then 1 tab by mouth daily for 2 days 02/16/20   Ethell Blatchford, Verline Lema E, PA-C    Allergies    Penicillins  Review of Systems   Review of Systems All other systems are reviewed and are negative for acute change except as noted in the HPI.  Physical Exam Updated Vital  Signs BP (!) 146/84   Pulse 89   Temp 98.1 F (36.7 C) (Oral)   Resp (!) 22   Ht 5' 7.5" (1.715 m)   Wt 72.6 kg   SpO2 98%   BMI 24.69 kg/m   Physical Exam Vitals and nursing note reviewed.  Constitutional:      General: He is not in acute distress.    Appearance: He is not ill-appearing.  HENT:     Head: Normocephalic and atraumatic.     Right Ear: Tympanic membrane and external ear normal.     Left Ear: Tympanic membrane and external ear normal.     Nose: Nose normal.     Mouth/Throat:     Mouth: Mucous membranes are moist.     Pharynx: Oropharynx is clear.  Eyes:     General: No scleral icterus.       Right eye: No discharge.        Left eye: No discharge.     Extraocular Movements: Extraocular movements intact.     Conjunctiva/sclera: Conjunctivae normal.     Pupils: Pupils are equal, round, and reactive to light.  Neck:     Vascular: No JVD.  Cardiovascular:     Rate and Rhythm: Normal rate and regular rhythm.     Pulses: Normal pulses.          Radial pulses are 2+ on the right side and 2+ on the left side.     Heart sounds: Normal heart sounds.  Pulmonary:     Comments: Lungs clear to auscultation in all fields. Symmetric chest rise. No wheezing, rales, or rhonchi. Abdominal:     Palpations: There is no mass.     Tenderness: There is no right CVA tenderness or left CVA tenderness.     Hernia: No hernia is present.     Comments: Abdomen is soft, non-distended, and non-tender in all quadrants. No rigidity, no guarding. No peritoneal signs.  Musculoskeletal:        General: Normal range of motion.     Cervical back: Normal range of motion.       Back:     Comments: Tenderness to palpation as depicted in image above. No overlying skin changes.   Well healed surgical scar over spinous processes of lumbar spine.    Negative straight leg raise test bilaterally.   Bony tenderness to deep palpation of left hip. Full ROM of left hip, knee and ankle.  Ambulates  with normal gait using cane.  Skin:    General: Skin is warm and dry.     Capillary Refill: Capillary refill takes less than 2 seconds.  Neurological:     Mental Status: He is oriented to person, place, and time.     GCS: GCS  eye subscore is 4. GCS verbal subscore is 5. GCS motor subscore is 6.     Comments: Fluent speech, no facial droop.  Psychiatric:        Behavior: Behavior normal.     ED Results / Procedures / Treatments   Labs (all labs ordered are listed, but only abnormal results are displayed) Labs Reviewed - No data to display  EKG None  Radiology DG Lumbar Spine Complete  Result Date: 02/16/2020 CLINICAL DATA:  Midline low back pain EXAM: LUMBAR SPINE - COMPLETE 4+ VIEW COMPARISON:  None. 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 Normal alignment. No fracture. SI joints symmetric and unremarkable. Aortic atherosclerosis. No aneurysm. IMPRESSION: Diffuse degenerative disc and facet disease. No acute bony abnormality. Electronically Signed   By: Charlett Nose M.D.   On: 02/16/2020 17:16   DG Hip Unilat W or Wo Pelvis 2-3 Views Left  Result Date: 02/16/2020 CLINICAL DATA:  Left hip pain EXAM: DG HIP (WITH OR WITHOUT PELVIS) 2-3V LEFT COMPARISON:  None. FINDINGS: Hip joints and SI joints are symmetric and unremarkable. No acute bony abnormality. Specifically, no fracture, subluxation, or dislocation. Degenerative changes in the visualized lower lumbar spine. IMPRESSION: No acute bony abnormality. Electronically Signed   By: Charlett Nose M.D.   On: 02/16/2020 17:17    Procedures Procedures (including critical care time)  Medications Ordered in ED Medications  lidocaine (LIDODERM) 5 % 1 patch (1 patch Transdermal Patch Applied 02/16/20 1639)    ED Course  I have reviewed the triage vital signs and the nursing notes.  Pertinent labs & imaging results that were available during my care of the patient were reviewed by me and  considered in my medical decision making (see chart for details).    MDM Rules/Calculators/A&P                      Patient seen and examined. Patient presents awake, alert, hemodynamically stable, afebrile, non toxic. No tachycardia or hypoxia. On exam he has tenderness to palpation of paraspinal muscles of the left lumbar spine.  He also has tenderness to deep palpation of his left hip.  He is able to ambulate without difficulty.  No mass or abdominal hernia. Neuro exam is unremarkable.  Left lower extremity is neurovascularly intact.  X-rays of lumbar spine and left hip viewed by me and ED attending are negative for signs of fracture or dislocation. I discussed results with patient and his spouse.  Engaged in shared decision making regarding CT imaging of the lumbar spine given his pain.  Patient would rather follow-up with his PCP at the [place].  Discussing with ED attending Dr. Rush Landmark we agreed on plan to give prednisone taper and short course of Robaxin as his symptoms seem to be radicular in nature.  I discussed the risks with patient the risks of prednisone and muscle relaxers.  The patient appears reasonably screened and/or stabilized for discharge and I doubt any other medical condition or other [facility] requiring further screening, evaluation, or treatment in the ED at this time prior to discharge. The patient is safe for discharge with strict return precautions discussed.  Patient appears reliable for PCP and neurosurgery follow-up.    Portions of this note were generated with Scientist, clinical (histocompatibility and immunogenetics). Dictation errors may occur despite best attempts at proofreading.   Final Clinical Impression(s) / ED Diagnoses Final diagnoses:  Acute left-sided low back pain without sciatica    Rx / DC  Orders ED Discharge Orders         Ordered    methocarbamol (ROBAXIN) 500 MG tablet  Daily at bedtime     02/16/20 1825    predniSONE (STERAPRED UNI-PAK 21 TAB) 10 MG (21) TBPK tablet  Daily      02/16/20 1825    diclofenac Sodium (VOLTAREN) 1 % GEL  4 times daily     02/16/20 1829           Kathyrn Lass 02/16/20 1917    Tegeler, Canary Brim, MD 02/16/20 2337

## 2021-02-22 IMAGING — CR DG LUMBAR SPINE COMPLETE 4+V
5 series · 5 of 5 positions shown · non-contrast
Comparison: None.

CLINICAL DATA: Midline low back pain

EXAM:
LUMBAR SPINE - COMPLETE 4+ VIEW

[l-spine ap]
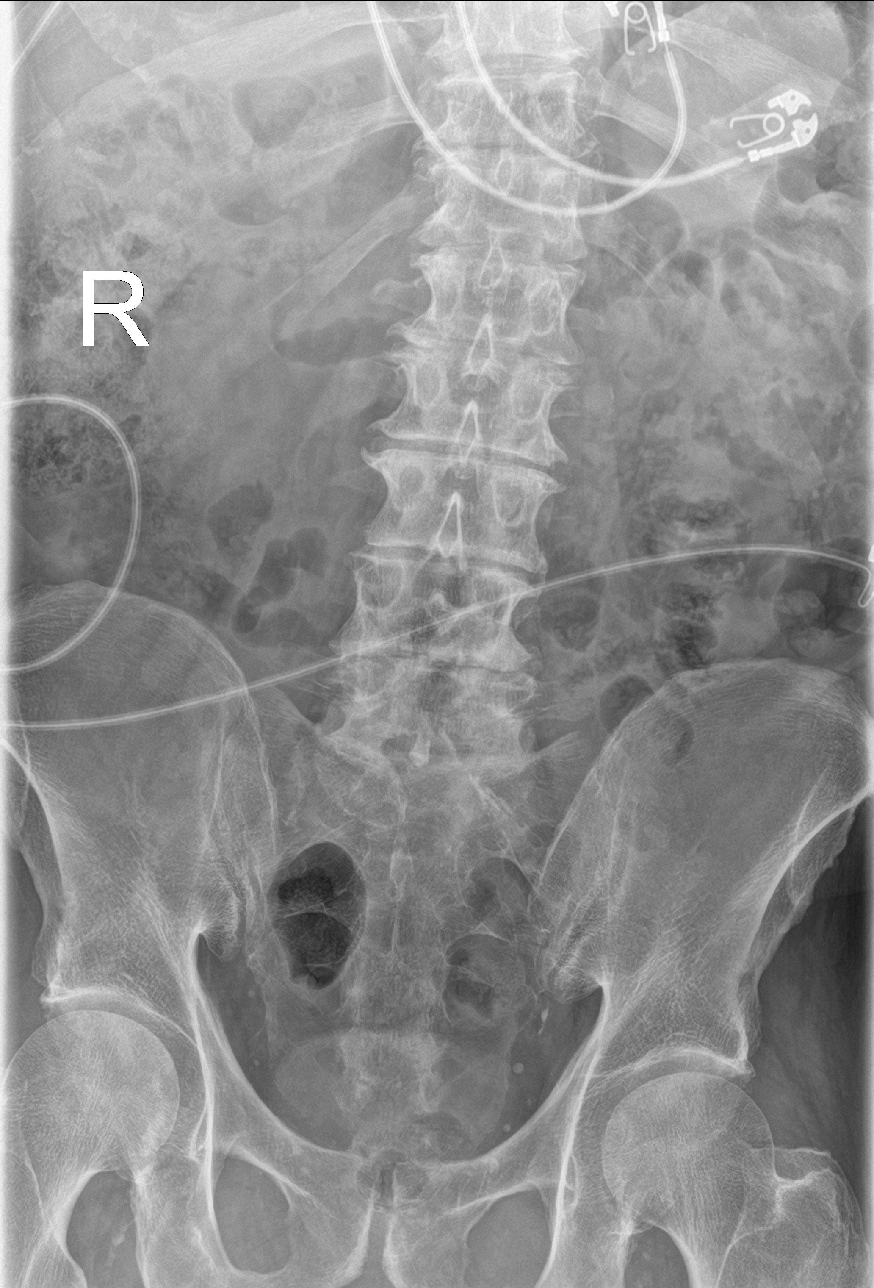

[l-spine obl (1 of 2)]
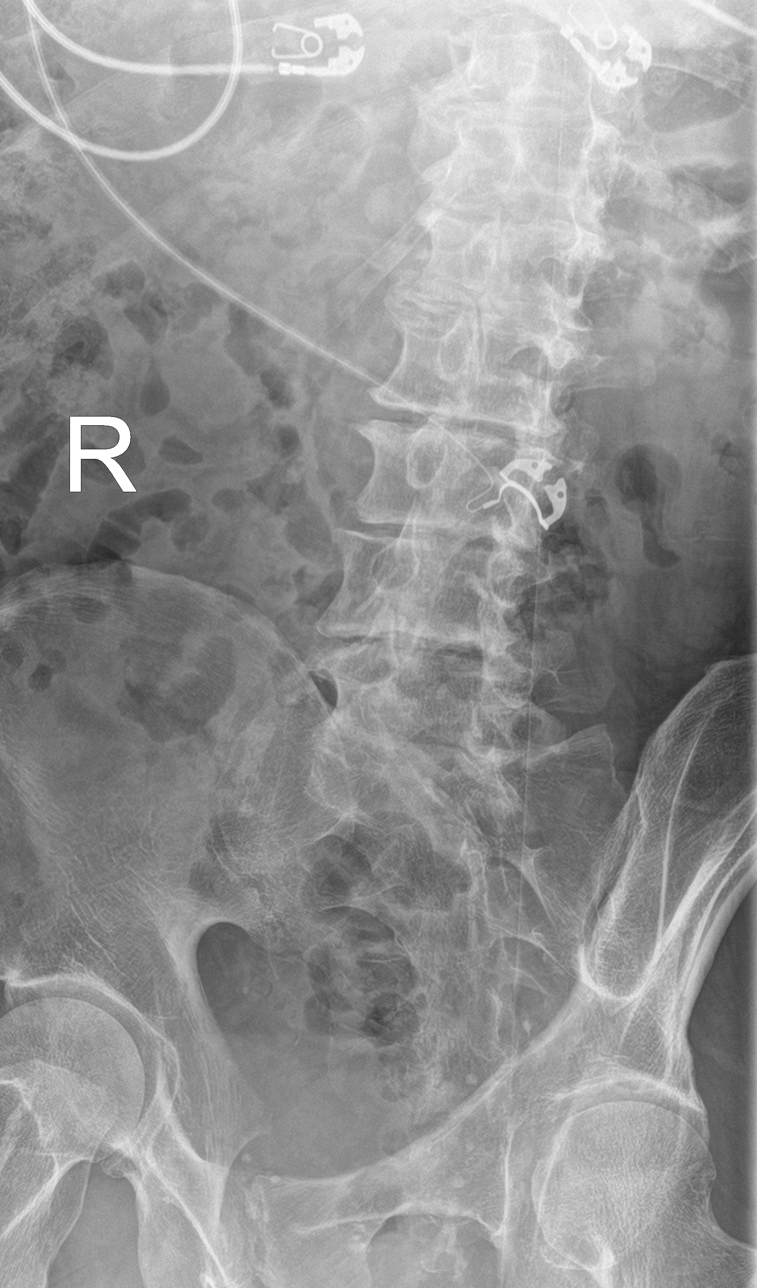

[l-spine obl (2 of 2)]
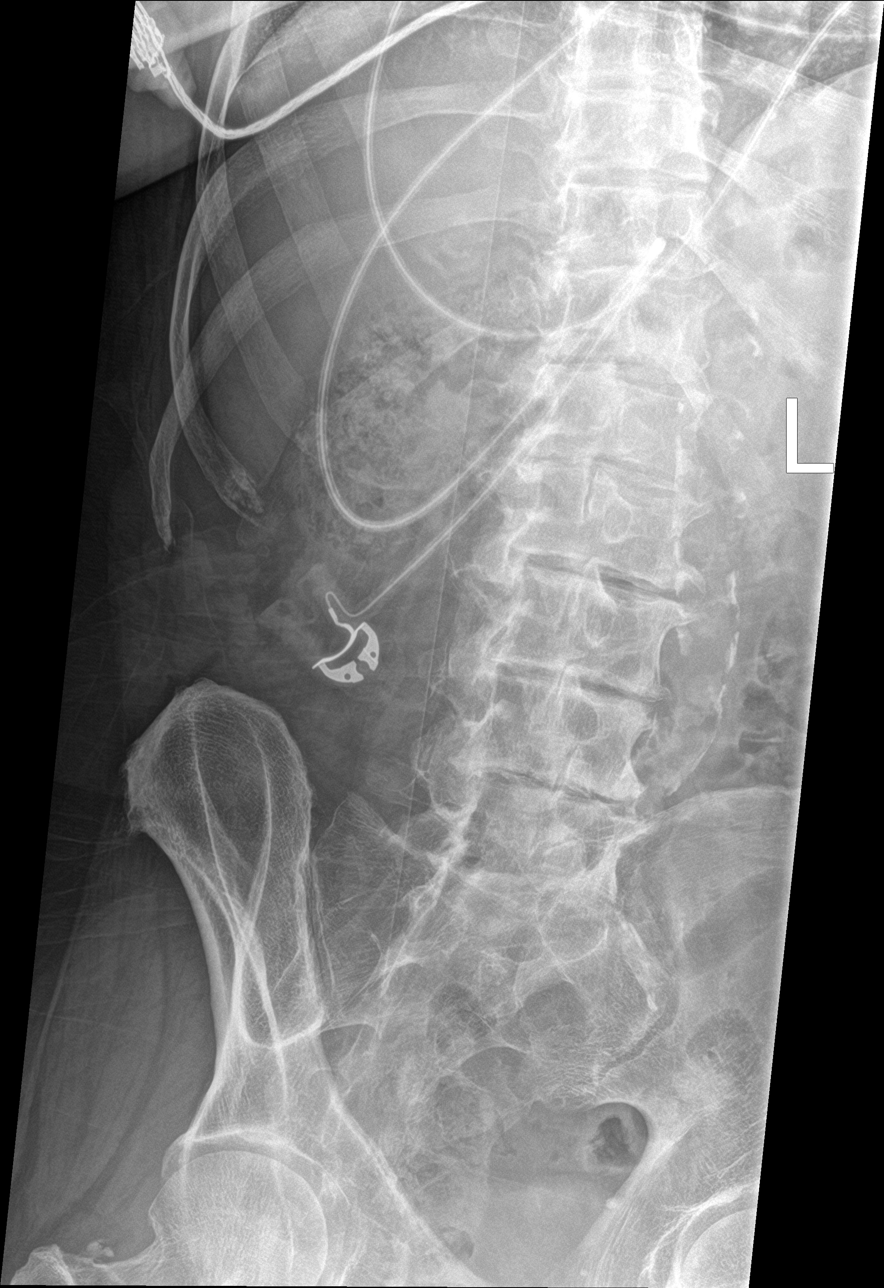

[l-spine lat]
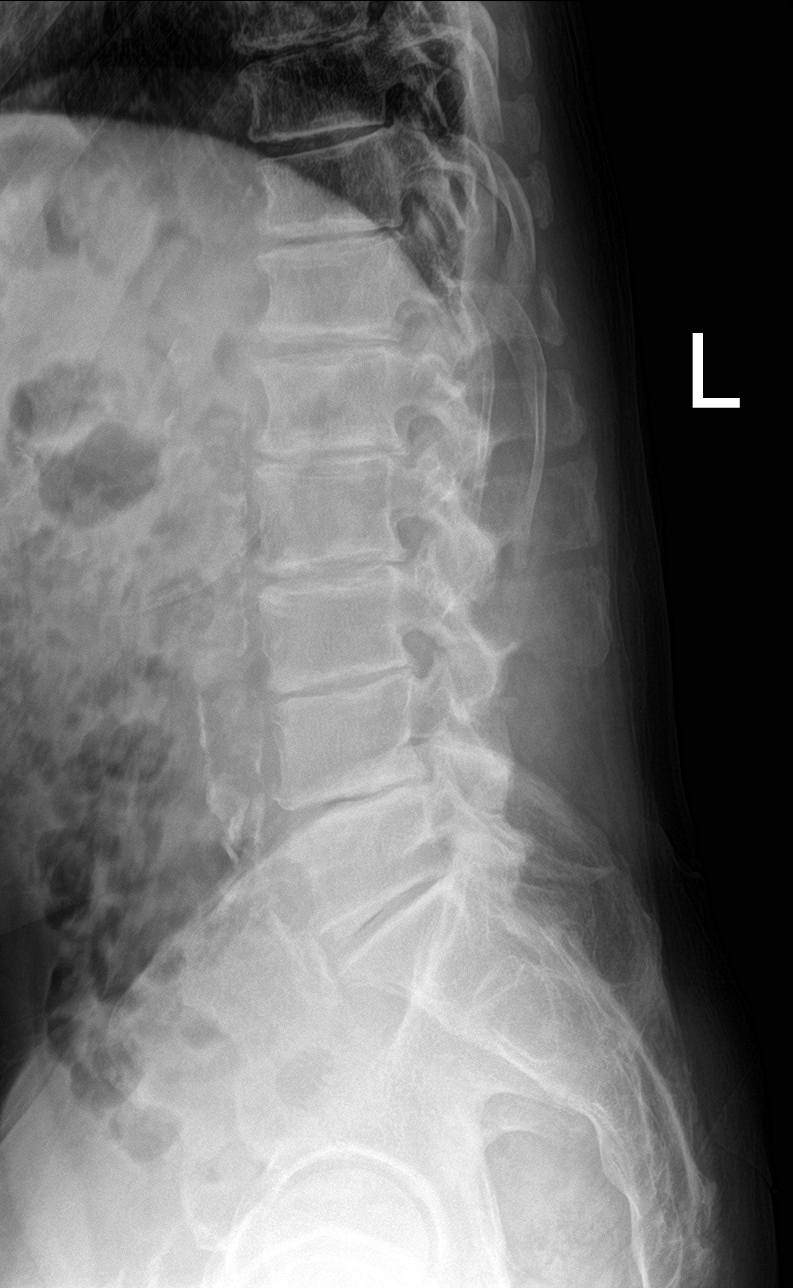

[l-spine spot]
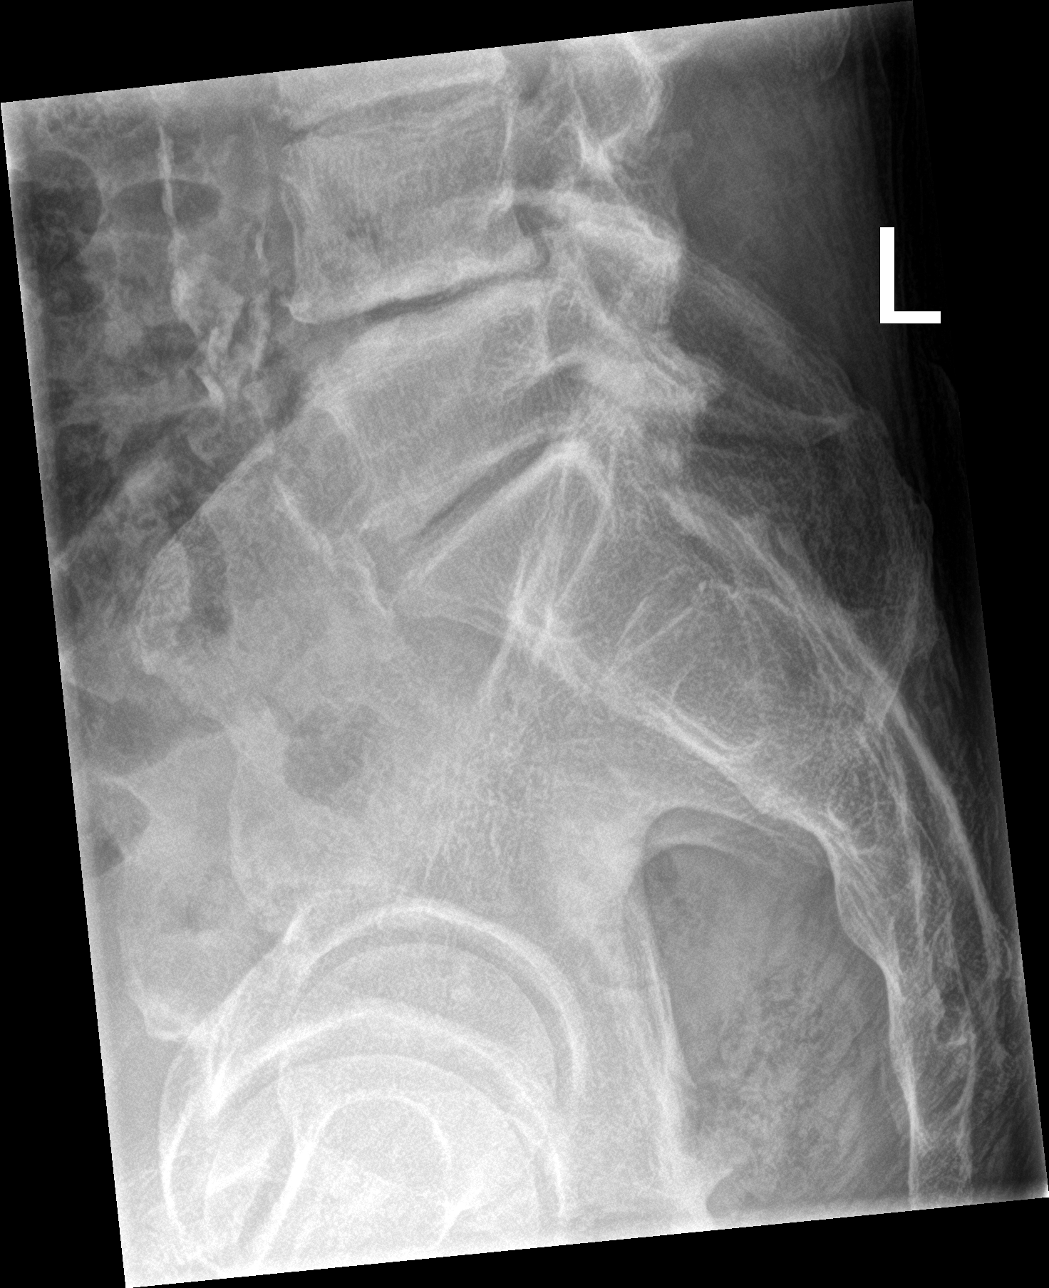

[5 of 5 positions shown; findings below may reference images not displayed]

FINDINGS: Diffuse degenerative disc and facet disease throughout the lumbar
spine. Normal alignment. No fracture. SI joints symmetric and
unremarkable. Aortic atherosclerosis. No aneurysm.
IMPRESSION: Diffuse degenerative disc and facet disease. No acute bony
abnormality.

## 2023-02-26 ENCOUNTER — Ambulatory Visit: Payer: Medicare Other | Admitting: Physician Assistant

## 2023-02-26 ENCOUNTER — Encounter: Payer: Self-pay | Admitting: Physician Assistant

## 2023-02-26 VITALS — BP 152/84 | HR 88 | Ht 68.0 in | Wt 166.0 lb

## 2023-02-26 DIAGNOSIS — I10 Essential (primary) hypertension: Secondary | ICD-10-CM | POA: Diagnosis not present

## 2023-02-26 DIAGNOSIS — J0110 Acute frontal sinusitis, unspecified: Secondary | ICD-10-CM

## 2023-02-26 MED ORDER — BENZONATATE 100 MG PO CAPS: ORAL_CAPSULE | ORAL | 0 refills | Status: AC

## 2023-02-26 MED ORDER — FLUTICASONE PROPIONATE 50 MCG/ACT NA SUSP: 2.0000 | Freq: Every day | NASAL | 6 refills | Status: AC

## 2023-02-26 MED ORDER — AZITHROMYCIN 250 MG PO TABS: ORAL_TABLET | ORAL | 0 refills | Status: AC

## 2023-02-26 NOTE — Patient Instructions (Signed)
You are going to take azithromycin as directed.  You can use Flonase to help with sinus pressure and sinus headaches.  I also sent a prescription of Tessalon Perles to help you with your cough.  Make sure that you are getting plenty of rest and staying very well-hydrated.    I hope that you feel better soon, please let us know if there is anything else we can do for you  Roney Jaffe, PA-C Physician Assistant [facility] [email or web address]   Sinus Infection, Adult A sinus infection, also called sinusitis, is inflammation of your sinuses. Sinuses are hollow spaces in the bones around your face. Your sinuses are located: Around your eyes. In the middle of your forehead. Behind your nose. In your cheekbones. Mucus normally drains out of your sinuses. When your nasal tissues become inflamed or swollen, mucus can become trapped or blocked. This allows bacteria, viruses, and fungi to grow, which leads to infection. Most infections of the sinuses are caused by a virus. A sinus infection can develop quickly. It can last for up to 4 weeks (acute) or for more than 12 weeks (chronic). A sinus infection often develops after a cold. What are the causes? This condition is caused by anything that creates swelling in the sinuses or stops mucus from draining. This includes: Allergies. Asthma. Infection from bacteria or viruses. Deformities or blockages in your nose or sinuses. Abnormal growths in the nose (nasal polyps). Pollutants, such as chemicals or irritants in the air. Infection from fungi. This is rare. What increases the risk? You are more likely to develop this condition if you: Have a weak body defense system (immune system). Do a lot of swimming or diving. Overuse nasal sprays. Smoke. What are the signs or symptoms? The main symptoms of this condition are pain and a feeling of pressure around the affected sinuses. Other symptoms  include: Stuffy nose or congestion that makes it difficult to breathe through your nose. Thick yellow or greenish drainage from your nose. Tenderness, swelling, and warmth over the affected sinuses. A cough that may get worse at night. Decreased sense of smell and taste. Extra mucus that collects in the throat or the back of the nose (postnasal drip) causing a sore throat or bad breath. Tiredness (fatigue). Fever. How is this diagnosed? This condition is diagnosed based on: Your symptoms. Your medical history. A physical exam. Tests to find out if your condition is acute or chronic. This may include: Checking your nose for nasal polyps. Viewing your sinuses using a device that has a light (endoscope). Testing for allergies or bacteria. Imaging tests, such as an MRI or CT scan. In rare cases, a bone biopsy may be done to rule out more serious types of fungal sinus disease. How is this treated? Treatment for a sinus infection depends on the cause and whether your condition is chronic or acute. If caused by a virus, your symptoms should go away on their own within 10 days. You may be given medicines to relieve symptoms. They include: Medicines that shrink swollen nasal passages (decongestants). A spray that eases inflammation of the nostrils (topical intranasal corticosteroids). Rinses that help get rid of thick mucus in your nose (nasal saline washes). Medicines that treat allergies (antihistamines). Over-the-counter pain relievers. If caused by bacteria, your health care provider may recommend waiting to see if your symptoms improve. Most bacterial infections will get better without antibiotic medicine. You may be given antibiotics if you have: A severe infection. A weak  immune system. If caused by narrow nasal passages or nasal polyps, surgery may be needed. Follow these instructions at home: Medicines Take, use, or apply over-the-counter and prescription medicines only as told by  your health care provider. These may include nasal sprays. If you were prescribed an antibiotic medicine, take it as told by your health care provider. Do not stop taking the antibiotic even if you start to feel better. Hydrate and humidify  Drink enough fluid to keep your urine pale yellow. Staying hydrated will help to thin your mucus. Use a cool mist humidifier to keep the humidity level in your home above 50%. Inhale steam for 10-15 minutes, 3-4 times a day, or as told by your health care provider. You can do this in the bathroom while a hot shower is running. Limit your exposure to cool or dry air. Rest Rest as much as possible. Sleep with your head raised (elevated). Make sure you get enough sleep each night. General instructions  Apply a warm, moist washcloth to your face 3-4 times a day or as told by your health care provider. This will help with discomfort. Use nasal saline washes as often as told by your health care provider. Wash your hands often with soap and water to reduce your exposure to germs. If soap and water are not available, use hand sanitizer. Do not smoke. Avoid being around people who are smoking (secondhand smoke). Keep all follow-up visits. This is important. Contact a health care provider if: You have a fever. Your symptoms get worse. Your symptoms do not improve within 10 days. Get help right away if: You have a severe headache. You have persistent vomiting. You have severe pain or swelling around your face or eyes. You have vision problems. You develop confusion. Your neck is stiff. You have trouble breathing. These symptoms may be an emergency. Get help right away. Call 911. Do not wait to see if the symptoms will go away. Do not drive yourself to the hospital. Summary A sinus infection is soreness and inflammation of your sinuses. Sinuses are hollow spaces in the bones around your face. This condition is caused by nasal tissues that become inflamed  or swollen. The swelling traps or blocks the flow of mucus. This allows bacteria, viruses, and fungi to grow, which leads to infection. If you were prescribed an antibiotic medicine, take it as told by your health care provider. Do not stop taking the antibiotic even if you start to feel better. Keep all follow-up visits. This is important. This information is not intended to replace advice given to you by your health care provider. Make sure you discuss any questions you have with your health care provider. Document Revised: 08/29/2021 Document Reviewed: 08/29/2021 Elsevier Patient Education  2023 [employer].

## 2023-02-26 NOTE — Progress Notes (Unsigned)
New Patient Office Visit  Subjective    Patient ID: Louis [name], male    DOB: 1946-06-07  Age: 77 y.o. MRN: [id number]  CC:  Chief Complaint  Patient presents with   Cough        Diarrhea    Yesterday morning, and after.    Ear Pain    Both ears     HPI Louis [name] states that he has been experieni Congestion - cream color Saturday night Looose stoolssunday morning ears started  Minor fever / chills Been to [place] 5 /7 in [place]   Cough flu drink drink - allergy pill and throat lon - helping some Eating and drinking okay   No abx    Outpatient Encounter Medications as of 02/26/2023  Medication Sig   Ascorbic Acid (VITAMIN C) 1000 MG tablet Take 1,000 mg by mouth daily.   cholecalciferol (VITAMIN D3) 25 MCG (1000 UNIT) tablet Take 1,000 Units by mouth daily.   clonazePAM (KLONOPIN) 1 MG tablet Take 2 mg by mouth daily as needed for anxiety.   clopidogrel (PLAVIX) 75 MG tablet Take 75 mg by mouth daily.   Cyanocobalamin (VITAMIN B 12 PO) Take 1 tablet by mouth daily.   cyclobenzaprine (FLEXERIL) 10 MG tablet Take 10 mg by mouth at bedtime.    diclofenac Sodium (VOLTAREN) 1 % GEL Apply 2 g topically 4 (four) times daily.   donepezil (ARICEPT) 10 MG tablet Take 5 mg by mouth at bedtime.   fentaNYL (DURAGESIC) 75 MCG/HR Place 1 patch onto the skin every other day.   fluticasone (FLONASE) 50 MCG/ACT nasal spray Place 1 spray into both nostrils daily as needed for allergies or rhinitis.   Multiple Vitamin (MULTIVITAMIN WITH MINERALS) TABS tablet Take 1 tablet by mouth daily.   oxyCODONE (OXY IR/ROXICODONE) 5 MG immediate release tablet Take 10 mg by mouth daily as needed for severe pain.   predniSONE (STERAPRED UNI-PAK 21 TAB) 10 MG (21) TBPK tablet Take by mouth daily. Take 6 tabs by mouth daily  for 2 days, then 5 tabs for 2 days, then 4 tabs for 2 days, then 3 tabs for 2 days, 2 tabs for 2 days, then 1 tab by mouth daily for 2 days   tetrahydrozoline-zinc (VISINE-AC)  0.05-0.25 % ophthalmic solution Place 2 drops into both eyes daily as needed (for dry eyes).   No facility-administered encounter medications on file as of 02/26/2023.    Past Medical History:  Diagnosis Date   Hypertension     Past Surgical History:  Procedure Laterality Date   BACK SURGERY      No family history on file.  Social History   Socioeconomic History   Marital status: Married    Spouse name: Not on file   Number of children: Not on file   Years of education: Not on file   Highest education level: Not on file  Occupational History   Not on file  Tobacco Use   Smoking status: Not on file   Smokeless tobacco: Not on file  Substance and Sexual Activity   Alcohol use: Not on file   Drug use: Not on file   Sexual activity: Not on file  Other Topics Concern   Not on file  Social History Narrative   Not on file   Social Determinants of Health   Financial Resource Strain: Not on file  Food Insecurity: Not on file  Transportation Needs: Not on file  Physical Activity: Not on file  Stress: Not  on file  Social Connections: Not on file  Intimate Partner Violence: Not on file    ROS      Objective    There were no vitals taken for this visit.  Physical Exam  {Labs (Optional):23779}    Assessment & Plan:   Problem List Items Addressed This Visit   None   No follow-ups on file.   Kasandra Knudsen Mayers, PA-C

## 2023-02-27 ENCOUNTER — Encounter: Payer: Self-pay | Admitting: Physician Assistant

## 2023-07-04 ENCOUNTER — Ambulatory Visit (HOSPITAL_COMMUNITY): Payer: Medicare Other | Admitting: Psychiatry

## 2023-08-22 ENCOUNTER — Ambulatory Visit (HOSPITAL_BASED_OUTPATIENT_CLINIC_OR_DEPARTMENT_OTHER): Payer: No Typology Code available for payment source | Admitting: Psychiatry

## 2023-08-22 ENCOUNTER — Encounter (HOSPITAL_COMMUNITY): Payer: Self-pay | Admitting: Psychiatry

## 2023-08-22 ENCOUNTER — Other Ambulatory Visit: Payer: Self-pay

## 2023-08-22 VITALS — BP 153/96 | HR 81 | Ht 67.0 in | Wt 171.0 lb

## 2023-08-22 DIAGNOSIS — F4310 Post-traumatic stress disorder, unspecified: Secondary | ICD-10-CM | POA: Diagnosis not present

## 2023-08-22 DIAGNOSIS — E785 Hyperlipidemia, unspecified: Secondary | ICD-10-CM | POA: Insufficient documentation

## 2023-08-22 DIAGNOSIS — I10 Essential (primary) hypertension: Secondary | ICD-10-CM | POA: Insufficient documentation

## 2023-08-22 DIAGNOSIS — F09 Unspecified mental disorder due to known physiological condition: Secondary | ICD-10-CM | POA: Insufficient documentation

## 2023-08-22 DIAGNOSIS — R413 Other amnesia: Secondary | ICD-10-CM

## 2023-08-22 DIAGNOSIS — I2510 Atherosclerotic heart disease of native coronary artery without angina pectoris: Secondary | ICD-10-CM | POA: Insufficient documentation

## 2023-08-22 DIAGNOSIS — G459 Transient cerebral ischemic attack, unspecified: Secondary | ICD-10-CM | POA: Insufficient documentation

## 2023-08-22 DIAGNOSIS — D485 Neoplasm of uncertain behavior of skin: Secondary | ICD-10-CM | POA: Insufficient documentation
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
## 2023-09-12 DIAGNOSIS — H2513 Age-related nuclear cataract, bilateral: Secondary | ICD-10-CM | POA: Diagnosis not present

## 2024-11-17 ENCOUNTER — Ambulatory Visit: Payer: Self-pay
